# Patient Record
Sex: Female | Born: 1965 | Race: White | Hispanic: No | Smoking: Current every day smoker
Health system: Southern US, Community
[De-identification: ages and names within clinical notes are randomized; demographics above are authoritative.]

## PROBLEM LIST (undated history)

## (undated) DIAGNOSIS — I10 Essential (primary) hypertension: Secondary | ICD-10-CM

## (undated) HISTORY — DX: Essential (primary) hypertension: I10

## (undated) HISTORY — PX: TUBAL LIGATION: SHX77

---

## 2002-07-12 ENCOUNTER — Emergency Department (HOSPITAL_COMMUNITY): Admission: EM | Admit: 2002-07-12 | Discharge: 2002-07-12 | Payer: Self-pay | Admitting: Emergency Medicine

## 2003-10-04 ENCOUNTER — Emergency Department (HOSPITAL_COMMUNITY): Admission: EM | Admit: 2003-10-04 | Discharge: 2003-10-04 | Payer: Self-pay | Admitting: Family Medicine

## 2005-05-31 ENCOUNTER — Other Ambulatory Visit: Admission: RE | Admit: 2005-05-31 | Discharge: 2005-05-31 | Payer: Self-pay | Admitting: Family Medicine

## 2006-08-12 ENCOUNTER — Ambulatory Visit (HOSPITAL_COMMUNITY): Admission: RE | Admit: 2006-08-12 | Discharge: 2006-08-12 | Payer: Self-pay | Admitting: Obstetrics & Gynecology

## 2013-08-01 ENCOUNTER — Ambulatory Visit (INDEPENDENT_AMBULATORY_CARE_PROVIDER_SITE_OTHER): Payer: BC Managed Care – PPO | Admitting: Internal Medicine

## 2013-08-01 VITALS — BP 118/72 | HR 73 | Temp 99.3°F | Resp 18 | Ht 62.0 in | Wt 207.0 lb

## 2013-08-01 DIAGNOSIS — R6889 Other general symptoms and signs: Secondary | ICD-10-CM

## 2013-08-01 DIAGNOSIS — J209 Acute bronchitis, unspecified: Secondary | ICD-10-CM

## 2013-08-01 DIAGNOSIS — R509 Fever, unspecified: Secondary | ICD-10-CM

## 2013-08-01 LAB — POCT INFLUENZA A/B
Influenza A, POC: NEGATIVE
Influenza B, POC: NEGATIVE

## 2013-08-01 MED ORDER — AZITHROMYCIN 250 MG PO TABS: ORAL_TABLET | ORAL | Status: DC

## 2013-08-01 MED ORDER — HYDROCODONE-ACETAMINOPHEN 7.5-325 MG/15ML PO SOLN: 5.0000 mL | Freq: Four times a day (QID) | ORAL | Status: DC | PRN

## 2013-08-01 NOTE — Progress Notes (Signed)
   Subjective:    Patient ID: Shannon Good, female    DOB: 01/21/1966, 48 y.o.   MRN: [id number]  HPI    Review of Systems     Objective:   Physical Exam        Assessment & Plan:

## 2013-08-01 NOTE — Patient Instructions (Signed)
Acute Bronchitis Bronchitis is inflammation of the airways that extend from the windpipe into the lungs (bronchi). The inflammation often causes mucus to develop. This leads to a cough, which is the most common symptom of bronchitis.  In acute bronchitis, the condition usually develops suddenly and goes away over time, usually in a couple weeks. Smoking, allergies, and asthma can make bronchitis worse. Repeated episodes of bronchitis may cause further lung problems.  CAUSES Acute bronchitis is most often caused by the same virus that causes a cold. The virus can spread from person to person (contagious).  SIGNS AND SYMPTOMS   Cough.   Fever.   Coughing up mucus.   Body aches.   Chest congestion.   Chills.   Shortness of breath.   Sore throat.  DIAGNOSIS  Acute bronchitis is usually diagnosed through a physical exam. Tests, such as chest X-rays, are sometimes done to rule out other conditions.  TREATMENT  Acute bronchitis usually goes away in a couple weeks. Often times, no medical treatment is necessary. Medicines are sometimes given for relief of fever or cough. Antibiotics are usually not needed but may be prescribed in certain situations. In some cases, an inhaler may be recommended to help reduce shortness of breath and control the cough. A cool mist vaporizer may also be used to help thin bronchial secretions and make it easier to clear the chest.  HOME CARE INSTRUCTIONS  Get plenty of rest.   Drink enough fluids to keep your urine clear or pale yellow (unless you have a medical condition that requires fluid restriction). Increasing fluids may help thin your secretions and will prevent dehydration.   Only take over-the-counter or prescription medicines as directed by your health care provider.   Avoid smoking and secondhand smoke. Exposure to cigarette smoke or irritating chemicals will make bronchitis worse. If you are a smoker, consider using nicotine gum or skin  patches to help control withdrawal symptoms. Quitting smoking will help your lungs heal faster.   Reduce the chances of another bout of acute bronchitis by washing your hands frequently, avoiding people with cold symptoms, and trying not to touch your hands to your mouth, nose, or eyes.   Follow up with your health care provider as directed.  SEEK MEDICAL CARE IF: Your symptoms do not improve after 1 week of treatment.  SEEK IMMEDIATE MEDICAL CARE IF:  You develop an increased fever or chills.   You have chest pain.   You have severe shortness of breath.  You have bloody sputum.   You develop dehydration.  You develop fainting.  You develop repeated vomiting.  You develop a severe headache. MAKE SURE YOU:   Understand these instructions.  Will watch your condition.  Will get help right away if you are not doing well or get worse. Document Released: 05/24/2004 Document Revised: 12/17/2012 Document Reviewed: 10/07/2012 [employer] Patient Information 2014 [employer]  

## 2013-08-01 NOTE — Progress Notes (Signed)
   Subjective:    Patient ID: Shannon Good, female    DOB: 1965/10/15, 48 y.o.   MRN: [id number]  HPI Patient presents with a two day history of a dry cough, some PND, a clear runny nose, slight sore throat, and generally feels bad all over.  Patient feels slightly nauseated. Has a fever , body aches  Review of Systems     Objective:   Physical Exam  Constitutional: She is oriented to person, place, and time. She appears well-developed and well-nourished. No distress.  HENT:  Head: Normocephalic.  Right Ear: External ear normal.  Left Ear: External ear normal.  Nose: Mucosal edema, rhinorrhea and sinus tenderness present.  Mouth/Throat: Oropharynx is clear and moist.  Eyes: EOM are normal.  Neck: Normal range of motion. Neck supple. No thyromegaly present.  Cardiovascular: Normal rate, regular rhythm and normal heart sounds.   Pulmonary/Chest: Effort normal. She has no decreased breath sounds. She has no wheezes. She has rhonchi. She has no rales.  Musculoskeletal: Normal range of motion.  Lymphadenopathy:    She has cervical adenopathy.  Neurological: She is alert and oriented to person, place, and time. She exhibits normal muscle tone. Coordination normal.  Psychiatric: She has a normal mood and affect.     Results for orders placed in visit on 08/01/13  POCT INFLUENZA A/B      Result Value Ref Range   Influenza A, POC Negative     Influenza B, POC Negative      Bronchitis    Assessment & Plan:  Zpak/Lortab elixir

## 2013-08-16 ENCOUNTER — Ambulatory Visit (INDEPENDENT_AMBULATORY_CARE_PROVIDER_SITE_OTHER): Payer: BC Managed Care – PPO | Admitting: Family Medicine

## 2013-08-16 ENCOUNTER — Ambulatory Visit: Payer: BC Managed Care – PPO

## 2013-08-16 VITALS — BP 120/90 | HR 75 | Temp 98.0°F | Ht 62.0 in | Wt 204.1 lb

## 2013-08-16 DIAGNOSIS — R059 Cough, unspecified: Secondary | ICD-10-CM

## 2013-08-16 DIAGNOSIS — R05 Cough: Secondary | ICD-10-CM

## 2013-08-16 DIAGNOSIS — R918 Other nonspecific abnormal finding of lung field: Secondary | ICD-10-CM

## 2013-08-16 MED ORDER — HYDROCOD POLST-CHLORPHEN POLST 10-8 MG/5ML PO LQCR: 5.0000 mL | Freq: Two times a day (BID) | ORAL | Status: AC | PRN

## 2013-08-16 MED ORDER — LEVOFLOXACIN 500 MG PO TABS: 500.0000 mg | ORAL_TABLET | Freq: Every day | ORAL | Status: AC

## 2013-08-16 NOTE — Patient Instructions (Signed)

## 2013-08-16 NOTE — Progress Notes (Signed)
°  This chart was scribed for Johnn HaiKurt Lauestein, MD by Joaquin MusicKristina Sanchez-Matthews, ED Scribe. This patient was seen in room Room/bed 4 and the patient's care was started at 10:55 AM. Subjective:    Patient ID: Shannon Good, female    DOB: [birth year], 48 y.o.   MRN: [id number] Chief Complaint  Patient presents with   Generalized Body Aches    Completed z-apck, helped a little and sx's returned 2 weeks ago   Cough    non-productive. Did not take the Hycet due to side effects   Headache    Frontal   Wheezing    & some SOB   HPI Shannon Good is a 48 y.o. female who [facility] complaining of ongoing severe constant productive cough, HA, body aches that began about 3 weeks ago. She states she was previously seen and discharge with a Z-pack and denies improving. Pt states her cough has been keeping her up at night and states she was having SOB and wheezing this week. She states she generally takes Tucinex and has relief.  Pt states she works at [employer]  Review of Systems  Respiratory: Positive for cough and wheezing.   Neurological: Positive for headaches.   Objective:   Physical Exam  Nursing note and vitals reviewed. Constitutional: She is oriented to person, place, and time. She appears well-developed and well-nourished. No distress.  HENT:  Head: Normocephalic and atraumatic.  Right Ear: External ear normal.  Left Ear: External ear normal.  Dullness to the L TM. Otherwise HENT is normal  Eyes: Conjunctivae are normal. Pupils are equal, round, and reactive to light.  Neck: Normal range of motion. Neck supple. No thyromegaly present.  Cardiovascular: Normal rate and regular rhythm.   No murmur heard. Pulmonary/Chest: Effort normal. She has wheezes.  Bilateral wheezes.  Abdominal: Bowel sounds are normal. She exhibits no mass. There is no rebound and no guarding.  Musculoskeletal: Normal range of motion. She exhibits no tenderness.  Neurological: She is alert  and oriented to person, place, and time. She has normal reflexes.  Skin: Skin is warm and dry. She is not diaphoretic.  Psychiatric: She has a normal mood and affect. Her behavior is normal.   [facility] preliminary x-ray report read by Dr. Johnn HaiKurt Lauestein: Heavy interstitial markings with streaking R lower lobe density.  Triage Vitals:BP 120/90   Pulse 75   Temp(Src) 98 F (36.7 C) (Oral)   Ht 5\' 2"  (1.575 m)   Wt 204 lb 2 oz (92.59 kg)   BMI 37.33 kg/m2   SpO2 97% Assessment & Plan:   Orders Placed This Encounter  Procedures   DG Chest 2 View    Standing Status: Future     Number of Occurrences:      Standing Expiration Date: 10/17/2014    Order Specific Question:  Reason for Exam (SYMPTOM  OR DIAGNOSIS REQUIRED)    Answer:  cough    Order Specific Question:  Is the patient pregnant?    Answer:  No    Order Specific Question:  Preferred imaging location?    Answer:  External  Cough - Plan: DG Chest 2 View, chlorpheniramine-HYDROcodone (TUSSIONEX PENNKINETIC ER) 10-8 MG/5ML LQCR  Pulmonary infiltrate in right lung on CXR - Plan: chlorpheniramine-HYDROcodone (TUSSIONEX PENNKINETIC ER) 10-8 MG/5ML LQCR, levofloxacin (LEVAQUIN) 500 MG tablet  Signed, Elvina SidleKurt Lauenstein, MD

## 2014-06-30 IMAGING — CR DG CHEST 2V
2 series · 2 of 2 positions shown · non-contrast
Comparison: No priors.

CLINICAL DATA: Cough.

EXAM:
CHEST  2 VIEW

[PA]
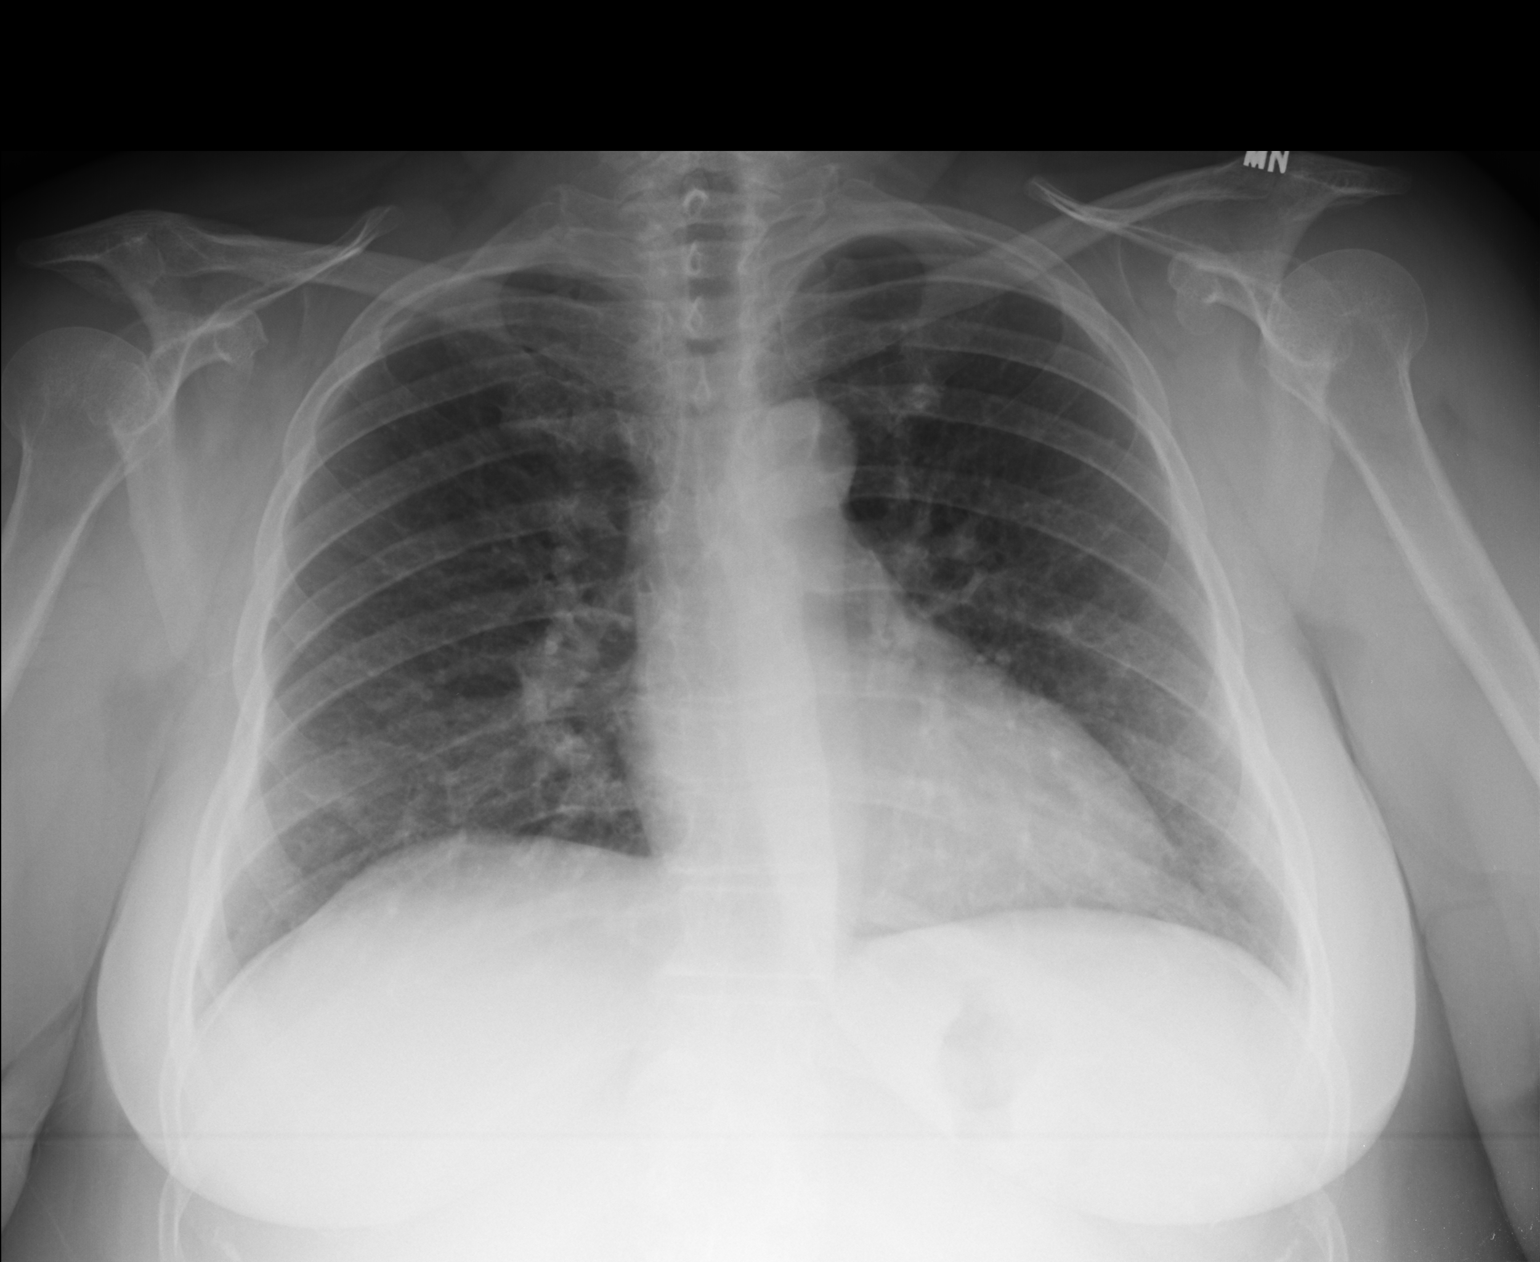

[lateral]
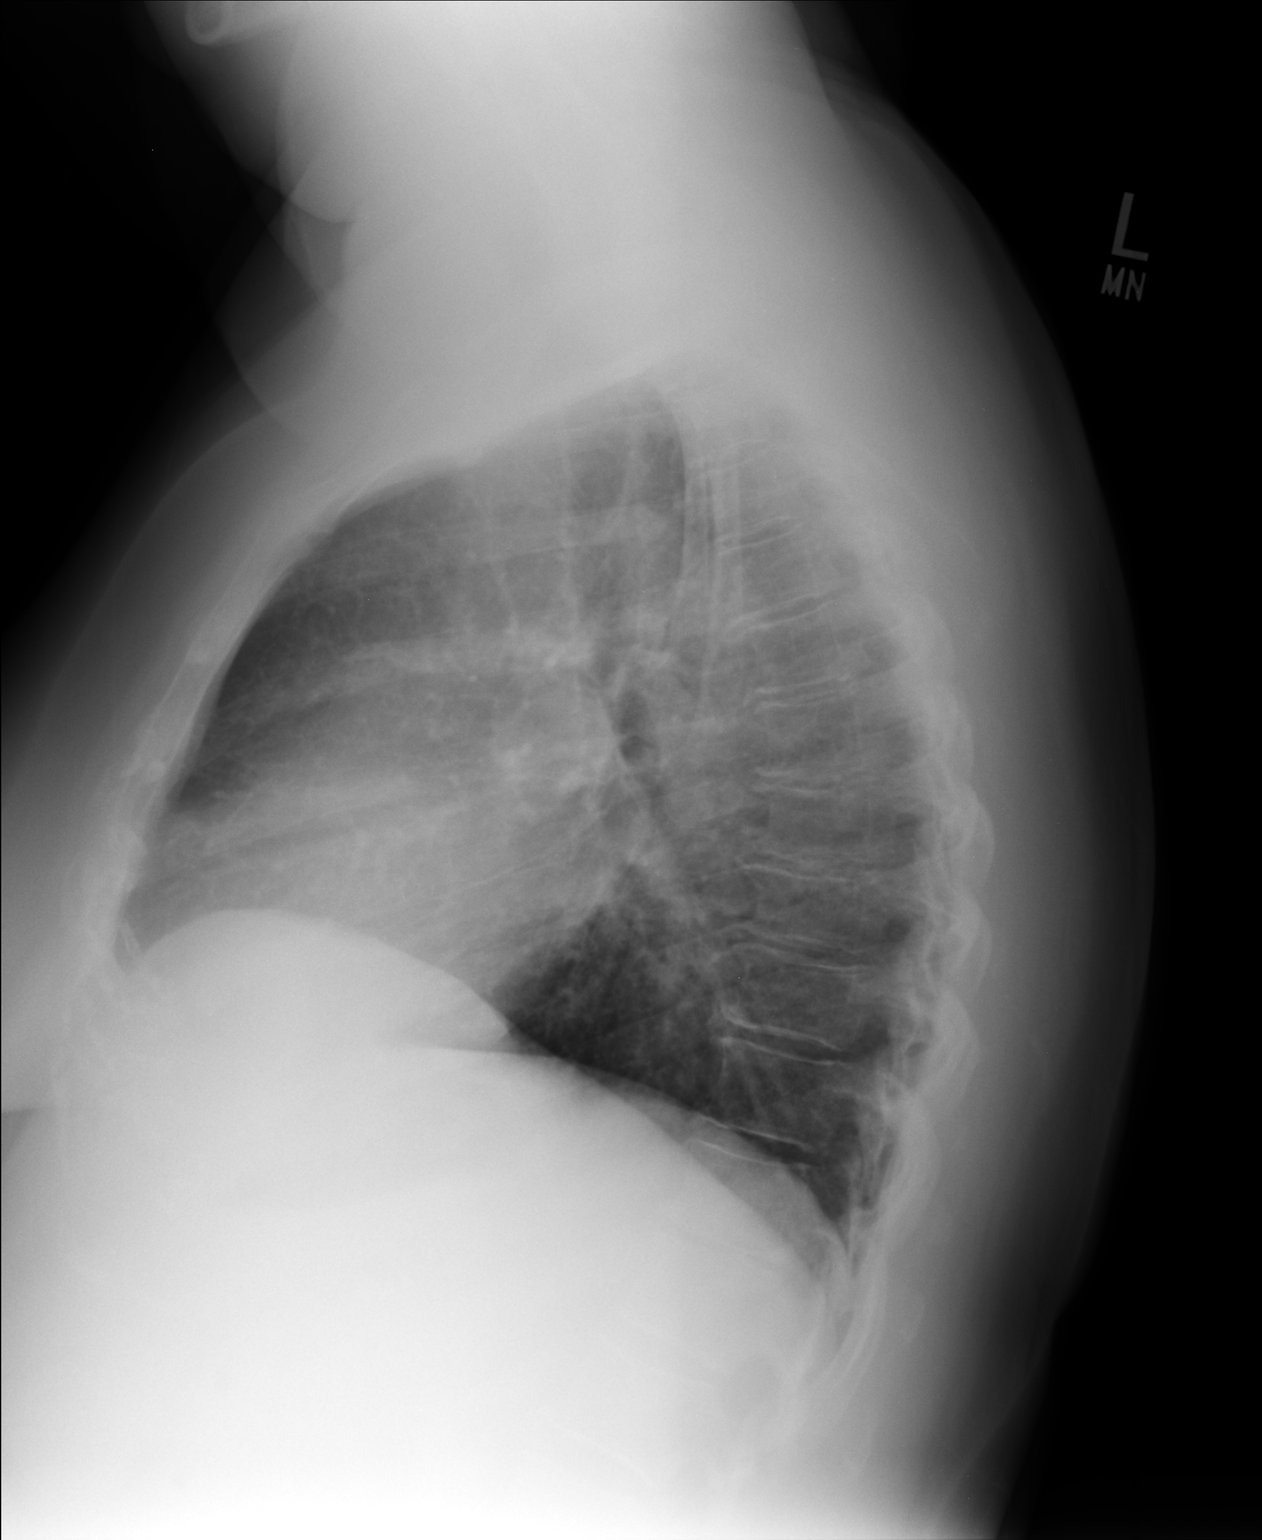

[2 of 2 positions shown; findings below may reference images not displayed]

FINDINGS: Mild diffuse peribronchial cuffing. Lung volumes are normal. No
consolidative airspace disease. No pleural effusions. No
pneumothorax. No pulmonary nodule or mass noted. Pulmonary
vasculature and the cardiomediastinal silhouette are within normal
limits.
IMPRESSION: 1. Mild diffuse peribronchial cuffing may reflect an acute
bronchitis.
